# Patient Record
Sex: Male | Born: 1960 | Race: White | Hispanic: No | Marital: Married | Smoking: Former smoker
Health system: Southern US, Community
[De-identification: ages and names within clinical notes are randomized; demographics above are authoritative.]

---

## 1999-05-24 ENCOUNTER — Ambulatory Visit (HOSPITAL_COMMUNITY): Admission: RE | Admit: 1999-05-24 | Discharge: 1999-05-24 | Payer: Self-pay | Admitting: Cardiology

## 2008-04-19 ENCOUNTER — Emergency Department (HOSPITAL_COMMUNITY): Admission: EM | Admit: 2008-04-19 | Discharge: 2008-04-19 | Payer: Self-pay | Admitting: Emergency Medicine

## 2010-06-14 LAB — POCT I-STAT, CHEM 8
BUN: 14 mg/dL (ref 6–23)
Calcium, Ion: 1.17 mmol/L (ref 1.12–1.32)
Chloride: 103 mEq/L (ref 96–112)
HCT: 43 % (ref 39.0–52.0)
Potassium: 4.1 mEq/L (ref 3.5–5.1)
Sodium: 140 mEq/L (ref 135–145)

## 2010-06-14 LAB — POCT CARDIAC MARKERS
CKMB, poc: <1 ng/mL — ABNORMAL LOW (ref 1.0–8.0)
Myoglobin, poc: 69.7 ng/mL (ref 12–200)
Troponin i, poc: <0.05 ng/mL (ref 0.00–0.09)

## 2010-07-15 NOTE — Cardiovascular Report (Signed)
[place]. [facility]  Patient:    IZIK, BINGMAN                       MRN: [id number] Proc. Date: 05/24/99 Adm. Date:  [id number] Disc. Date: [id number] Attending:  Eleanora Neighbor CC:         Cardiac Catheterization Laboratory             Louanna Raw, M.D.                        Cardiac Catheterization  PROCEDURE:  Left heart catheterization with selective coronary angiography, left ventricular angiography with Perclose.  CARDIOLOGIST:  Colleen Can. Deborah Chalk, M.D.  INDICATIONS:  Mr. Chien is a 50 year old male who presents with recurrent episodes of chest pain.  He is referred for a cardiac catheterization and a coronary arteriogram.  TYPE AND SITE OF ENTRY:  Percutaneous right femoral artery.  CATHETERS:  The 6-French, #4 curved Judkins right and left coronary catheters,  6-French pigtail ventriculographic catheter.  CONTRAST MATERIAL:  Omnipaque.  MEDICATIONS GIVEN DURING THE PROCEDURE:  Versed 3 mg IV.  COMMENTS:  The patient tolerated the procedure well.  HEMODYNAMIC DATA: Aortic pressure:  110/73. LV pressure:  113/8.  ANGIOGRAPHIC DATA: LEFT VENTRICULAR ANGIOGRAM:  The left ventricular angiogram was performed in the RAO position.  Overall cardiac size and silhouette are normal.  The left ventricular function is normal.  RESULTS: 1. Right coronary artery:  The right coronary artery is a dominant vessel.    There was some catheter-induced spasm, but the artery was normal. 2. Left main coronary artery:  The left main coronary artery is normal. 3. Left anterior descending coronary artery:  The left anterior descending    is a reasonably large vessel that extends to and around the apex.  It    tapers somewhat distally but remains normal.  The diagonal vessels are    normal. 4. Left circumflex coronary artery:  The left circumflex continues as a    bifurcating obtuse marginal vessel on the posterolateral wall.  It is     normal.  OVERALL IMPRESSION: 1. Normal coronary arteries. 2. Normal left ventricular function.  DISCUSSION:  It is felt that Mr. Giuliani is at very low risk from the standpoint of coronary artery disease, and his chest pain is noncardiac in nature. DD:  05/24/99 TD:  05/25/99 Job: [id number]

## 2010-07-15 NOTE — H&P (Signed)
[place]  Patient:    Ryan Byrd, Ryan Byrd                         MRN: Adm. Date:  05/24/99 Attending:  Colleen Can. Deborah Chalk, M.D. Dictator:   Jennet Maduro. Earl Gala, R.N., A.N.P. CC:         Louanna Raw, M.D., [facility]             Colleen Can. Deborah Chalk, M.D.                         History and Physical  CHIEF COMPLAINT:  Atypical chest pain.  HISTORY OF PRESENT ILLNESS:  Ryan Byrd is a 50 year old white male who has had no previous history of documented coronary disease.  He was seen as a work-in appointment on May 23, 1999 with complaints of atypical chest pain.  He has had previous cardiac evaluation, in April of last year, which was unremarkable.  He has not been seen back since that time, up until May 23, 1999 when he presented with chest discomfort.  He notes that approximately two weeks ago he had an ear infection and was placed on antibiotics.  There was some cough and nasal congestion and then following that, he began to have tightness in the chest.  It has somewhat waxed and waned.  It has been exertional as well as non-exertional in nature. Lifting seems to exaggerate the symptoms but walking does not.  There are complaints of left arm weakness and in light of his complaints, he is now referred for cardiac catheterization.  PAST MEDICAL HISTORY:  Chest discomfort in April of 2000 with a negative stress  echocardiogram.  SURGICAL HISTORY:  None.  ALLERGIES:  None.  CURRENT MEDICATIONS: 1. Claritin-D one a day. 2. Aspirin daily.  FAMILY HISTORY:  Father died at 56 of complications from diabetes, although it s not clear of the exact cause.  Mother is living, age 60.  There are no siblings.  SOCIAL HISTORY:  He is married.  He has two daughters.  There is no smoking and no alcohol use.  He does chew tobacco.  REVIEW OF SYSTEMS:  Review of systems is otherwise as stated above and unremarkable.  PHYSICAL  EXAMINATION:  GENERAL:  He is somewhat anxious.  He is in no acute distress.  VITAL SIGNS:  Blood pressure 140/100, sitting; 130/100, standing.  Heart rate is 84 and regular.  Respirations are 18.  He is afebrile.  SKIN:  Warm and dry.  Color is unremarkable.  LUNGS:  Clear.  HEART:  Regular rhythm.  ABDOMEN:  Soft.  Positive bowel sounds.  Nontender.  EXTREMITIES:  Without edema.  NEUROLOGIC:  Intact.  RECTAL:  Deferred.  LABORATORY AND X-RAY FINDINGS:  EKG shows normal sinus rhythm.  There are questionable changes consistent with inferior MI, yet is unchanged from past tracing.  Chest x-ray shows no abnormality and other labs are currently pending.  OVERALL IMPRESSION:  Atypical chest pain in the setting of previous evaluation,  which consisted of a negative stress echocardiogram.  PLAN:  We will proceed on with cardiac catheterization on May 24, 1999. DD:  05/23/99 TD:  05/23/99 Job: [id number]

## 2011-05-07 ENCOUNTER — Ambulatory Visit (INDEPENDENT_AMBULATORY_CARE_PROVIDER_SITE_OTHER): Payer: BC Managed Care – PPO | Admitting: Family Medicine

## 2011-05-07 VITALS — BP 120/82 | HR 102 | Temp 98.6°F | Resp 16 | Ht 71.0 in | Wt 234.0 lb

## 2011-05-07 DIAGNOSIS — J40 Bronchitis, not specified as acute or chronic: Secondary | ICD-10-CM

## 2011-05-07 DIAGNOSIS — I10 Essential (primary) hypertension: Secondary | ICD-10-CM | POA: Insufficient documentation

## 2011-05-07 MED ORDER — AZITHROMYCIN 250 MG PO TABS: ORAL_TABLET | ORAL | Status: AC

## 2011-05-07 MED ORDER — PREDNISONE 20 MG PO TABS: ORAL_TABLET | ORAL | Status: AC

## 2011-05-07 MED ORDER — HYDROCODONE-HOMATROPINE 5-1.5 MG/5ML PO SYRP: 5.0000 mL | ORAL_SOLUTION | Freq: Three times a day (TID) | ORAL | Status: AC | PRN

## 2011-05-07 NOTE — Progress Notes (Signed)
  Subjective:    Patient ID: Ryan Byrd, male    DOB: Sep 10, 1960, 51 y.o.   MRN: [id number]  Wheezing  This is a new problem. The current episode started in the past 7 days. The problem occurs constantly. The problem has been gradually worsening. Associated symptoms include coughing and a sore throat. The symptoms are aggravated by URIs. There is no history of asthma, bronchiolitis, CAD or chronic lung disease.  Cough Associated symptoms include a sore throat and wheezing. There is no history of asthma.      Review of Systems  HENT: Positive for sore throat.   Respiratory: Positive for cough and wheezing.        Objective:   Physical Exam  Constitutional: He appears well-developed and well-nourished.  HENT:  Head: Normocephalic and atraumatic.  Right Ear: External ear normal.  Left Ear: External ear normal.  Nose: Nose normal.  Mouth/Throat: Oropharynx is clear and moist.  Eyes: Conjunctivae and EOM are normal. Pupils are equal, round, and reactive to light.  Neck: Neck supple. No tracheal deviation present. No thyromegaly present.  Cardiovascular: Normal rate and regular rhythm.   Pulmonary/Chest: Effort normal. He has wheezes. He has no rales. He exhibits no tenderness.  Lymphadenopathy:    He has no cervical adenopathy.  Skin: Skin is warm and dry.          Assessment & Plan:  bronchitis

## 2013-05-02 ENCOUNTER — Ambulatory Visit
Admission: RE | Admit: 2013-05-02 | Discharge: 2013-05-02 | Disposition: A | Discharge status: Home / Self Care | Payer: BC Managed Care – PPO | Source: Ambulatory Visit | Attending: Physician Assistant | Admitting: Physician Assistant

## 2013-05-02 ENCOUNTER — Other Ambulatory Visit: Payer: Self-pay | Admitting: Physician Assistant

## 2013-05-02 DIAGNOSIS — R52 Pain, unspecified: Secondary | ICD-10-CM

## 2013-05-02 DIAGNOSIS — R609 Edema, unspecified: Secondary | ICD-10-CM

## 2013-05-02 DIAGNOSIS — W19XXXA Unspecified fall, initial encounter: Secondary | ICD-10-CM

## 2016-10-04 ENCOUNTER — Other Ambulatory Visit: Payer: Self-pay | Admitting: Physician Assistant

## 2016-10-04 ENCOUNTER — Ambulatory Visit
Admission: RE | Admit: 2016-10-04 | Discharge: 2016-10-04 | Disposition: A | Discharge status: Home / Self Care | Payer: 59 | Source: Ambulatory Visit | Attending: Physician Assistant | Admitting: Physician Assistant

## 2016-10-04 DIAGNOSIS — T1490XA Injury, unspecified, initial encounter: Secondary | ICD-10-CM

## 2016-10-04 DIAGNOSIS — R52 Pain, unspecified: Secondary | ICD-10-CM

## 2017-04-02 ENCOUNTER — Other Ambulatory Visit: Payer: Self-pay | Admitting: Physician Assistant

## 2017-04-02 ENCOUNTER — Ambulatory Visit
Admission: RE | Admit: 2017-04-02 | Discharge: 2017-04-02 | Disposition: A | Discharge status: Home / Self Care | Payer: 59 | Source: Ambulatory Visit | Attending: Physician Assistant | Admitting: Physician Assistant

## 2017-04-02 DIAGNOSIS — R05 Cough: Secondary | ICD-10-CM

## 2017-04-02 DIAGNOSIS — R059 Cough, unspecified: Secondary | ICD-10-CM

## 2019-06-13 ENCOUNTER — Ambulatory Visit: Payer: 59 | Attending: Internal Medicine

## 2019-06-13 DIAGNOSIS — Z23 Encounter for immunization: Secondary | ICD-10-CM

## 2019-06-13 NOTE — Progress Notes (Signed)
   Covid-19 Vaccination Clinic  Name:  Ryan Byrd    MRN: [id number] DOB: 30-Aug-1960  06/13/2019  [name] was observed post Covid-19 immunization for 15 minutes without incident. He was provided with Vaccine Information Sheet and instruction to access the V-Safe system.   [name] was instructed to call 911 with any severe reactions post vaccine: Marland Kitchen Difficulty breathing  . Swelling of face and throat  . A fast heartbeat  . A bad rash all over body  . Dizziness and weakness   Immunizations Administered    Name Date Dose VIS Date Route   Pfizer COVID-19 Vaccine 06/13/2019  1:58 PM 0.3 mL 02/07/2019 Intramuscular   Manufacturer: [employer], [employer]   Lot: [id number]   NDC: [address]-4

## 2019-07-07 ENCOUNTER — Ambulatory Visit: Payer: Self-pay | Attending: Internal Medicine

## 2019-07-07 DIAGNOSIS — Z23 Encounter for immunization: Secondary | ICD-10-CM

## 2019-07-07 NOTE — Progress Notes (Signed)
   Covid-19 Vaccination Clinic  Name:  CASWELL ALVILLAR    MRN: [id number] DOB: Jul 02, 1960  07/07/2019  Mr. Charter was observed post Covid-19 immunization for 15 minutes without incident. He was provided with Vaccine Information Sheet and instruction to access the V-Safe system.   Mr. Millstein was instructed to call 911 with any severe reactions post vaccine: Marland Kitchen Difficulty breathing  . Swelling of face and throat  . A fast heartbeat  . A bad rash all over body  . Dizziness and weakness   Immunizations Administered    Name Date Dose VIS Date Route   Pfizer COVID-19 Vaccine 07/07/2019  2:00 PM 0.3 mL 04/23/2018 Intramuscular   Manufacturer: [employer], [employer]   Lot: [id number]   NDC: [address]-8

## 2019-08-01 ENCOUNTER — Other Ambulatory Visit: Payer: Self-pay | Admitting: Physician Assistant

## 2019-08-01 ENCOUNTER — Ambulatory Visit
Admission: RE | Admit: 2019-08-01 | Discharge: 2019-08-01 | Disposition: A | Discharge status: Home / Self Care | Payer: No Typology Code available for payment source | Source: Ambulatory Visit | Attending: Physician Assistant | Admitting: Physician Assistant

## 2019-08-01 DIAGNOSIS — R0602 Shortness of breath: Secondary | ICD-10-CM

## 2019-08-01 DIAGNOSIS — M79644 Pain in right finger(s): Secondary | ICD-10-CM

## 2019-08-04 ENCOUNTER — Other Ambulatory Visit (HOSPITAL_COMMUNITY): Payer: Self-pay | Admitting: Physician Assistant

## 2019-08-04 DIAGNOSIS — R0602 Shortness of breath: Secondary | ICD-10-CM

## 2019-08-19 ENCOUNTER — Telehealth (HOSPITAL_COMMUNITY): Payer: Self-pay | Admitting: *Deleted

## 2019-08-19 NOTE — Telephone Encounter (Signed)
Patient given detailed instructions per Myocardial Perfusion Study Information Sheet for the test on 09/02/19. Patient notified to arrive 15 minutes early and that it is imperative to arrive on time for appointment to keep from having the test rescheduled.  If you need to cancel or reschedule your appointment, please call the office within 24 hours of your appointment. . Patient verbalized understanding. Ryan Byrd

## 2019-08-22 ENCOUNTER — Encounter (HOSPITAL_COMMUNITY): Payer: No Typology Code available for payment source

## 2019-08-27 ENCOUNTER — Telehealth (HOSPITAL_COMMUNITY): Payer: Self-pay

## 2019-08-27 NOTE — Telephone Encounter (Signed)
Spoke with the patient, detailed instructions given. He stated that he understood and would be here for his test. Asked to call back with any questions. S.Romolo Sieling EMTP 

## 2019-09-02 ENCOUNTER — Ambulatory Visit (HOSPITAL_COMMUNITY): Payer: No Typology Code available for payment source | Attending: Physician Assistant

## 2019-09-02 ENCOUNTER — Other Ambulatory Visit: Payer: Self-pay

## 2019-09-02 DIAGNOSIS — R0602 Shortness of breath: Secondary | ICD-10-CM | POA: Insufficient documentation

## 2019-09-02 LAB — MYOCARDIAL PERFUSION IMAGING
LV dias vol: 72 mL (ref 62–150)
LV sys vol: 32 mL
Peak HR: 109 {beats}/min
Rest HR: 82 {beats}/min
SDS: 0
SRS: 0
SSS: 0
TID: 0.87

## 2019-09-02 MED ORDER — REGADENOSON 0.4 MG/5ML IV SOLN
0.4000 mg | Freq: Once | INTRAVENOUS | Status: AC
  Administered 2019-09-02: 0.4 mg via INTRAVENOUS

## 2019-09-02 MED ORDER — TECHNETIUM TC 99M TETROFOSMIN IV KIT
10.6000 | PACK | Freq: Once | INTRAVENOUS | Status: AC | PRN
  Administered 2019-09-02: 10.6 via INTRAVENOUS
  Filled 2019-09-02: qty 11

## 2019-09-02 MED ORDER — TECHNETIUM TC 99M TETROFOSMIN IV KIT
31.5000 | PACK | Freq: Once | INTRAVENOUS | Status: AC | PRN
  Administered 2019-09-02: 31.5 via INTRAVENOUS
  Filled 2019-09-02: qty 32

## 2020-07-15 ENCOUNTER — Other Ambulatory Visit (HOSPITAL_COMMUNITY): Payer: Self-pay

## 2020-07-15 MED ORDER — TRIAMCINOLONE ACETONIDE 0.5 % EX CREA
TOPICAL_CREAM | CUTANEOUS | 0 refills | Status: AC
  Filled 2020-07-15: qty 15, 30d supply, fill #0

## 2020-10-12 ENCOUNTER — Other Ambulatory Visit: Payer: Self-pay | Admitting: Physician Assistant

## 2020-10-12 DIAGNOSIS — R160 Hepatomegaly, not elsewhere classified: Secondary | ICD-10-CM

## 2020-10-18 ENCOUNTER — Ambulatory Visit: Payer: No Typology Code available for payment source

## 2020-10-18 ENCOUNTER — Other Ambulatory Visit: Payer: Self-pay

## 2020-10-18 DIAGNOSIS — R160 Hepatomegaly, not elsewhere classified: Secondary | ICD-10-CM

## 2020-10-18 MED ORDER — GADOBUTROL 1 MMOL/ML IV SOLN
10.0000 mL | Freq: Once | INTRAVENOUS | Status: AC | PRN
  Administered 2020-10-18: 10 mL via INTRAVENOUS

## 2021-09-24 IMAGING — MR MR ABDOMEN WO/W CM
16 of 17 series · 44 of 48 positions shown · IV contrast (gadavist)
Comparison: 10/08/2020

CLINICAL DATA: Evaluate for liver mass identified on recent
abdominal sonogram.

EXAM:
MRI ABDOMEN WITHOUT AND WITH CONTRAST
TECHNIQUE: Multiplanar multisequence MR imaging of the abdomen was performed
both before and after the administration of intravenous contrast.
CONTRAST:  10mL GADAVIST GADOBUTROL 1 MMOL/ML IV SOLN

[Series 2: cor ssfse / · coronal · 6.0mm · 1.56mm/px · 1 of 36 slices shown]
[im 1/36]
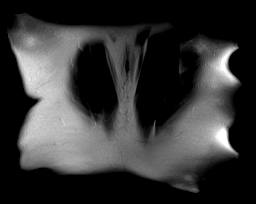

[Series 3: T2 fat-sat · axial · 6.0mm · 1.56mm/px · z∈[-125,+113]mm · 2 of 34 slices shown]
[im 1/34]
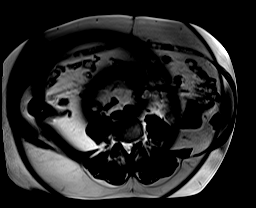
[im 34/34]
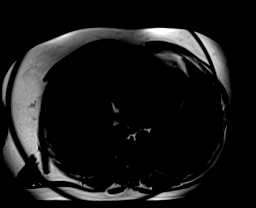

[Series 4: T1 · axial · 6.0mm · 0.78mm/px · z∈[-125,+113]mm · 3 of 68 slices shown]
[im 1/68]
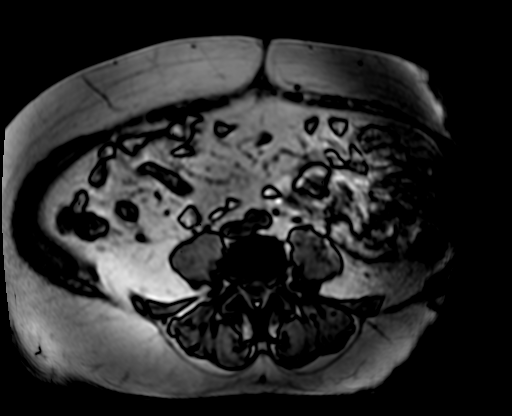
[im 34/68]
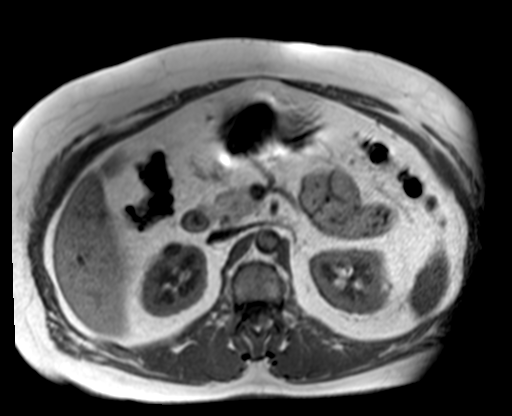
[im 68/68]
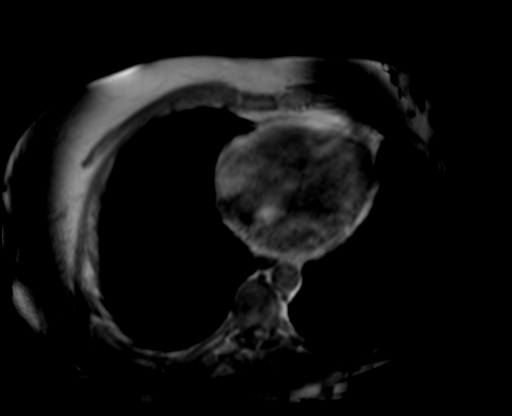

[Series 7: DWI · axial · 7.0mm · 2.00mm/px · z∈[-116,+161]mm · 5 of 102 slices shown (1 of 2)]
[im 1/102]
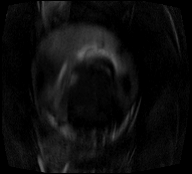
[im 26/102]
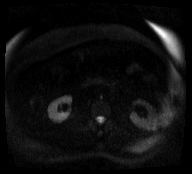
[im 51/102]
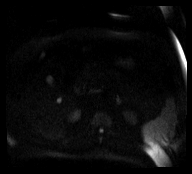
[im 76/102]
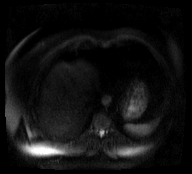
[im 102/102]
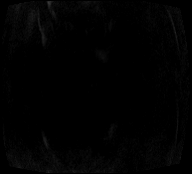

[Series 8: DWI · axial · 7.0mm · 2.00mm/px · z∈[-116,+161]mm · 2 of 34 slices shown (2 of 2)]
[im 1/34]
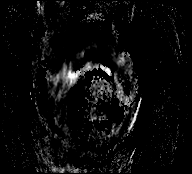
[im 34/34]
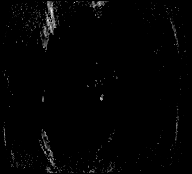

[Series 9: bSSFP · axial · 6.0mm · 0.78mm/px · z∈[-125,+113]mm · 2 of 34 slices shown]
[im 1/34]
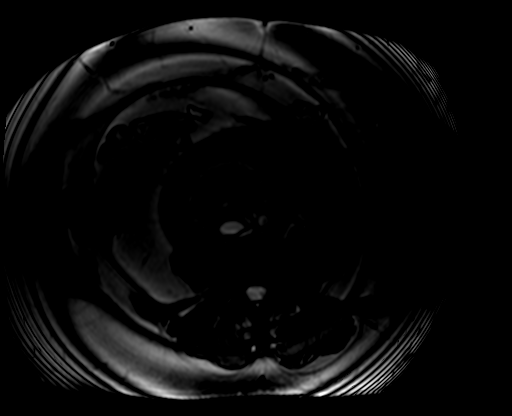
[im 34/34]
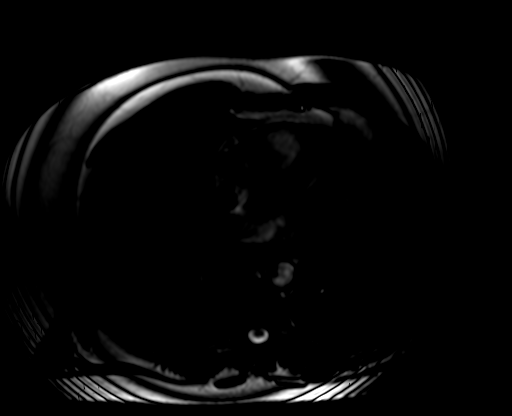

[Series 10: axial dynamic pre · axial · non-contrast · 4.0mm · 1.25mm/px · z∈[-122,+114]mm · 3 of 60 slices shown]
[im 1/60]
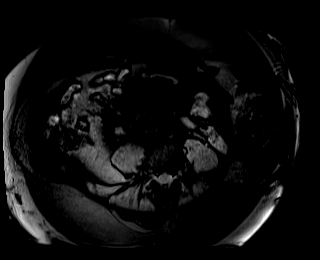
[im 30/60]
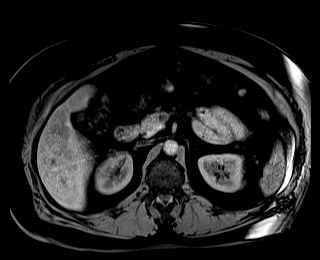
[im 60/60]
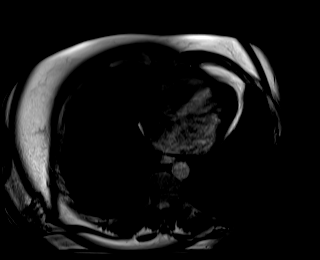

[Series 11: axial dynamic post · axial · 4.0mm · 1.25mm/px · z∈[-122,+114]mm · 3 of 60 slices shown (1 of 6)]
[im 1/60]
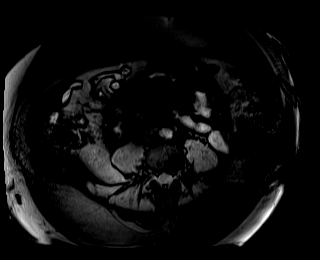
[im 30/60]
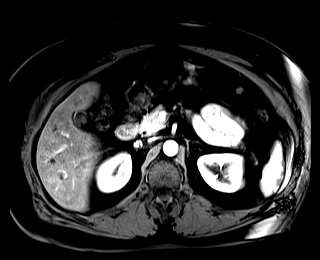
[im 60/60]
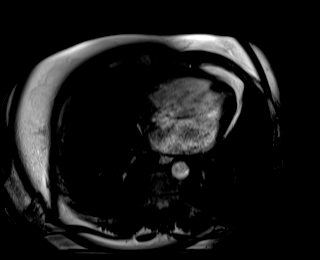

[Series 12: axial dynamic post · axial · 4.0mm · 1.25mm/px · z∈[-122,+114]mm · 3 of 60 slices shown (2 of 6)]
[im 1/60]
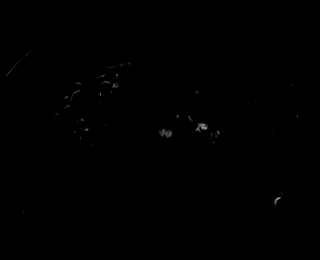
[im 30/60]
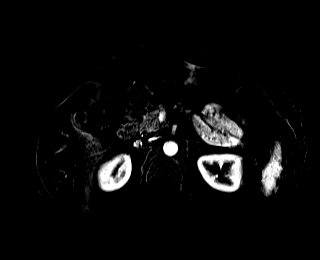
[im 60/60]
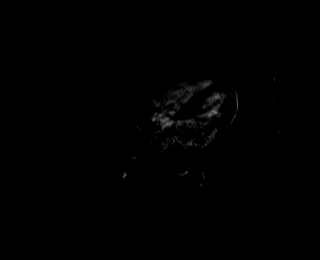

[Series 13: axial dynamic post · axial · 4.0mm · 1.25mm/px · z∈[-122,+114]mm · 3 of 60 slices shown (3 of 6)]
[im 1/60]
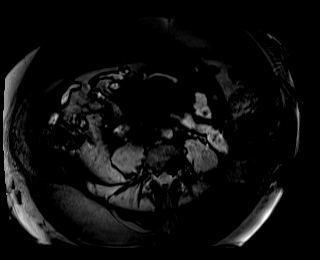
[im 30/60]
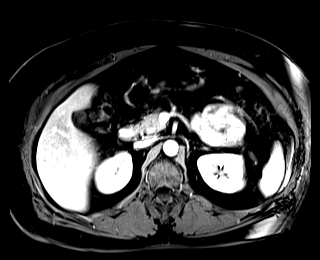
[im 60/60]
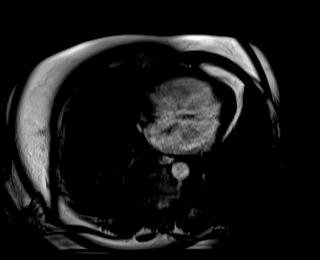

[Series 14: axial dynamic post · axial · 4.0mm · 1.25mm/px · z∈[-122,+114]mm · 3 of 60 slices shown (4 of 6)]
[im 1/60]
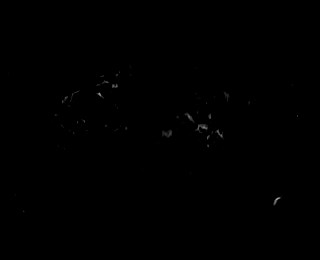
[im 30/60]
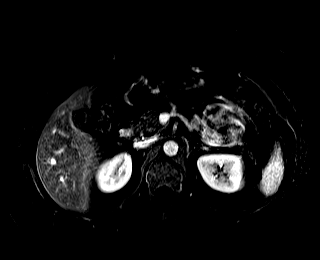
[im 60/60]
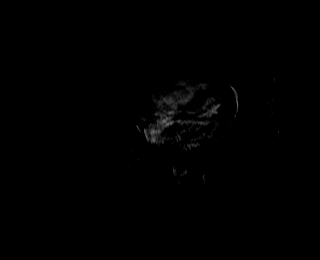

[Series 15: axial dynamic post · axial · 4.0mm · 1.25mm/px · z∈[-122,+114]mm · 3 of 60 slices shown (5 of 6)]
[im 1/60]
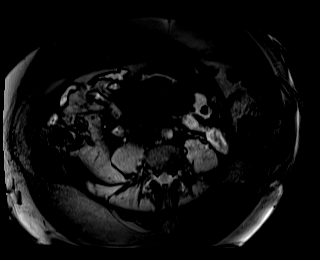
[im 30/60]
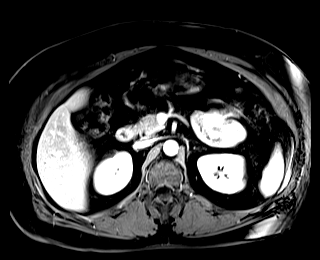
[im 60/60]
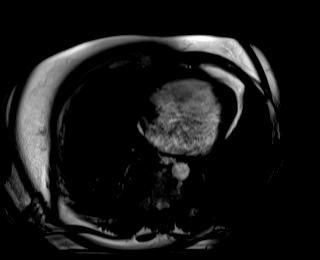

[Series 16: axial dynamic post · axial · 4.0mm · 1.25mm/px · z∈[-122,+114]mm · 3 of 60 slices shown (6 of 6)]
[im 1/60]
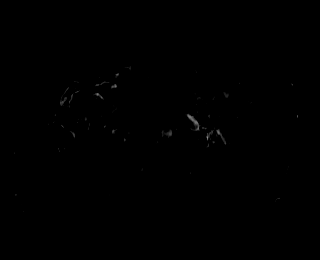
[im 30/60]
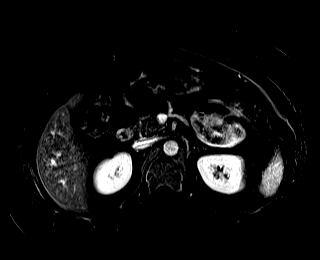
[im 60/60]
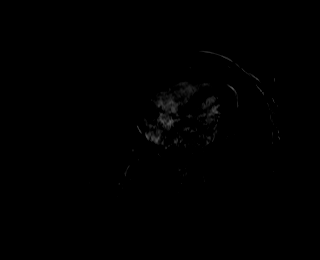

[Series 18: axial ssfse / · axial · 6.0mm · 1.25mm/px · z∈[-125,+113]mm · 2 of 34 slices shown]
[im 1/34]
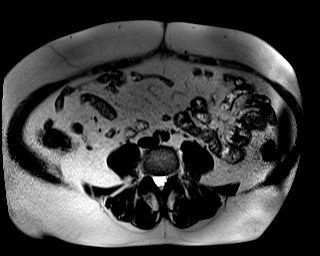
[im 34/34]
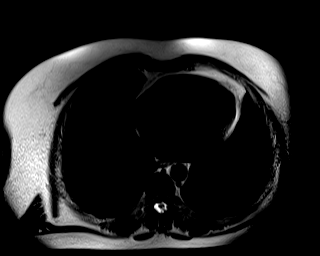

[Series 19: axial dynamic delayed · axial · 4.0mm · 1.25mm/px · z∈[-122,+114]mm · 3 of 60 slices shown]
[im 1/60]
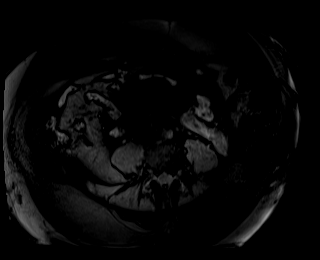
[im 30/60]
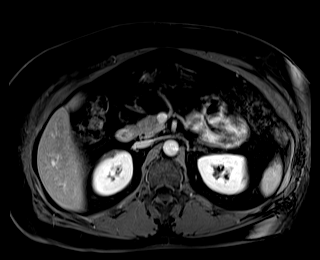
[im 60/60]
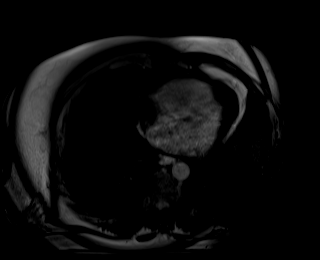

[Series 20: axial dynamic delayed_sub · axial · 4.0mm · 1.25mm/px · z∈[-122,+114]mm · 3 of 60 slices shown]
[im 1/60]
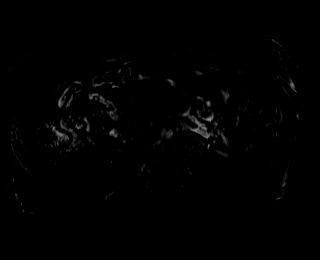
[im 30/60]
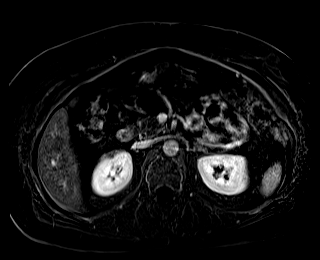
[im 60/60]
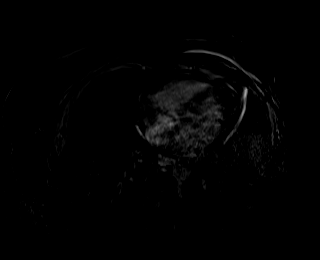

[44 of 48 positions shown; findings below may reference images not displayed]

FINDINGS: Lower chest: No acute findings.

Hepatobiliary: There is diffuse hepatic steatosis.

Within the lateral segment of left hepatic lobe there is a 1 cm T2
hyperintense structure without internal enhancement compatible with
simple cyst, image [DATE].

1.1 cm T2 hyperintense structure within posterior right hepatic lobe
is noted, image [DATE]. This shows mild early peripheral discontiguous
enhancement with delayed fill-in compatible with a benign
hemangioma.

No suspicious liver abnormalities identified.

Gallbladder appears normal.  No gallstones.

Pancreas: No mass, inflammatory changes, or other parenchymal
abnormality identified.

Spleen:  Within normal limits in size and appearance.

Adrenals/Urinary Tract: Normal adrenal glands. Simple appearing cyst
arises off the upper pole of the right kidney measuring 1.5 cm. No
suspicious mass. No hydronephrosis.

Stomach/Bowel: Visualized portions within the abdomen are
unremarkable.

Vascular/Lymphatic: No pathologically enlarged lymph nodes
identified. No abdominal aortic aneurysm demonstrated.

Other:  No ascites or focal fluid collection.

Musculoskeletal: No suspicious bone lesions identified.
IMPRESSION: 1. No suspicious liver lesions identified.
2. Benign appearing cyst corresponds to the recent ultrasound
abnormality in the left hepatic lobe. There is a second lesion
within the posterior right hepatic lobe which has signal and
enhancement characteristics compatible with a small benign
hemangioma.
3. Hepatic steatosis.

## 2022-11-17 ENCOUNTER — Other Ambulatory Visit: Payer: Self-pay | Admitting: Family Medicine

## 2022-11-17 DIAGNOSIS — R1011 Right upper quadrant pain: Secondary | ICD-10-CM

## 2022-11-22 ENCOUNTER — Ambulatory Visit
Admission: RE | Admit: 2022-11-22 | Discharge: 2022-11-22 | Disposition: A | Discharge status: Home / Self Care | Payer: 59 | Source: Ambulatory Visit | Attending: Family Medicine | Admitting: Family Medicine

## 2022-11-22 DIAGNOSIS — R1011 Right upper quadrant pain: Secondary | ICD-10-CM
